# Patient Record
Sex: Male | Born: 1990 | Race: Black or African American | Hispanic: No | Marital: Single | Smoking: Never smoker
Health system: Southern US, Community
[De-identification: ages and names within clinical notes are randomized; demographics above are authoritative.]

---
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
## 2011-02-20 ENCOUNTER — Emergency Department (HOSPITAL_COMMUNITY)
Admission: EM | Admit: 2011-02-20 | Discharge: 2011-02-20 | Discharge status: Against Medical Advice | Payer: Self-pay | Attending: Emergency Medicine | Admitting: Emergency Medicine

## 2011-02-20 DIAGNOSIS — H9209 Otalgia, unspecified ear: Secondary | ICD-10-CM | POA: Insufficient documentation

## 2011-05-04 ENCOUNTER — Encounter: Payer: Self-pay | Admitting: *Deleted

## 2011-05-04 DIAGNOSIS — E049 Nontoxic goiter, unspecified: Secondary | ICD-10-CM | POA: Insufficient documentation

## 2011-05-04 DIAGNOSIS — R7303 Prediabetes: Secondary | ICD-10-CM | POA: Insufficient documentation

## 2011-05-04 DIAGNOSIS — E669 Obesity, unspecified: Secondary | ICD-10-CM

## 2014-02-19 ENCOUNTER — Encounter (HOSPITAL_COMMUNITY): Payer: Self-pay | Admitting: Emergency Medicine

## 2014-02-19 ENCOUNTER — Emergency Department (HOSPITAL_COMMUNITY)
Admission: EM | Admit: 2014-02-19 | Discharge: 2014-02-19 | Disposition: A | Discharge status: Home / Self Care | Payer: Self-pay | Attending: Emergency Medicine | Admitting: Emergency Medicine

## 2014-02-19 ENCOUNTER — Emergency Department (HOSPITAL_COMMUNITY): Payer: Self-pay

## 2014-02-19 DIAGNOSIS — R071 Chest pain on breathing: Secondary | ICD-10-CM

## 2014-02-19 DIAGNOSIS — R05 Cough: Secondary | ICD-10-CM

## 2014-02-19 DIAGNOSIS — R059 Cough, unspecified: Secondary | ICD-10-CM

## 2014-02-19 DIAGNOSIS — J40 Bronchitis, not specified as acute or chronic: Secondary | ICD-10-CM | POA: Insufficient documentation

## 2014-02-19 DIAGNOSIS — R0789 Other chest pain: Secondary | ICD-10-CM

## 2014-02-19 DIAGNOSIS — R1110 Vomiting, unspecified: Secondary | ICD-10-CM | POA: Insufficient documentation

## 2014-02-19 DIAGNOSIS — M940 Chondrocostal junction syndrome [Tietze]: Secondary | ICD-10-CM | POA: Insufficient documentation

## 2014-02-19 DIAGNOSIS — R042 Hemoptysis: Secondary | ICD-10-CM | POA: Insufficient documentation

## 2014-02-19 LAB — URINALYSIS, ROUTINE W REFLEX MICROSCOPIC
Bilirubin Urine: NEGATIVE
Glucose, UA: NEGATIVE mg/dL
Hgb urine dipstick: NEGATIVE
Ketones, ur: NEGATIVE mg/dL
Nitrite: NEGATIVE
Protein, ur: NEGATIVE mg/dL
Specific Gravity, Urine: 1.022 (ref 1.005–1.030)
Urobilinogen, UA: 1 mg/dL (ref 0.0–1.0)
pH: 7.5 (ref 5.0–8.0)

## 2014-02-19 LAB — URINE MICROSCOPIC-ADD ON

## 2014-02-19 MED ORDER — BENZONATATE 100 MG PO CAPS: 200.0000 mg | ORAL_CAPSULE | Freq: Two times a day (BID) | ORAL | Status: DC | PRN

## 2014-02-19 MED ORDER — IBUPROFEN 600 MG PO TABS: 600.0000 mg | ORAL_TABLET | Freq: Four times a day (QID) | ORAL | Status: AC | PRN

## 2014-02-19 NOTE — ED Notes (Signed)
Pt reports productive cough x 3 days. States that yesterday and this AM he noticed hemoptysis. Pt also reports urinary frequency and dysuria x 2 days with left sided flank pain. Pt concerned because he took Niacin so he could pass a UDS, and states "ever since I took that medication all of this has been going on."

## 2014-02-19 NOTE — ED Provider Notes (Signed)
CSN: [id number]     Arrival date & time 02/19/14  1409 History   First MD Initiated Contact with Patient 02/19/14 1624     This chart was scribed for non-physician practitioner, Billy Madura, PA-C working with Gavin Pound. Oletta Lamas, MD by Arlan Organ, ED Scribe. This patient was seen in room TR10C/TR10C and the patient's care was started at 4:37 PM.   Chief Complaint  Patient presents with  . Cough    HPI  HPI Comments: Billy Kirby is a 23 y.o. male who presents to the Emergency Department complaining of an ongoing dry cough consisting of bright red blood x 5 days. Pt reports 3-4 episodes of bloody mucous onset yesterday morning. He has not tried anything OTC for his symptoms, but states he has been drinking a large amount of Orange juice. Symptoms associated with L anterolateral chest discomfort described as sharp and 1 episode of NB/NB emesis. At this time he denies any congestion, central/substernal CP, LOC or near syncope, leg swelling, or hematuria. He denies a PMHx of bleeding disorders or coagulopathies. Denies a personal and family history of blood clots. Denies any surgeries or hospitalizations. Pt has no pertinent medical history, and no other concerns this visit.   History reviewed. No pertinent past medical history. History reviewed. No pertinent past surgical history. History reviewed. No pertinent family history. History  Substance Use Topics  . Smoking status: Never Smoker   . Smokeless tobacco: Not on file  . Alcohol Use: Yes    Review of Systems  Constitutional: Positive for fever. Negative for chills.  HENT: Negative for congestion.   Respiratory: Positive for cough.   Cardiovascular: Positive for chest pain.  Gastrointestinal: Positive for vomiting.  Neurological: Negative for weakness, light-headedness and numbness.     Allergies  Chloraseptic sore throat  Home Medications   Current Outpatient Rx  Name  Route  Sig  Dispense  Refill  . benzonatate (TESSALON)  100 MG capsule   Oral   Take 2 capsules (200 mg total) by mouth 2 (two) times daily as needed for cough.   20 capsule   0   . ibuprofen (ADVIL,MOTRIN) 600 MG tablet   Oral   Take 1 tablet (600 mg total) by mouth every 6 (six) hours as needed.   30 tablet   0     Triage Vitals: BP 137/82  Pulse 73  Temp(Src) 99.8 F (37.7 C)  Resp 18  Wt 174 lb 1 oz (78.954 kg)  SpO2 98%   Physical Exam  Nursing note and vitals reviewed. Constitutional: He is oriented to person, place, and time. He appears well-developed and well-nourished. No distress.  HENT:  Head: Normocephalic and atraumatic.  Mouth/Throat: Oropharynx is clear and moist. No oropharyngeal exudate.  Eyes: Conjunctivae and EOM are normal. Pupils are equal, round, and reactive to light. No scleral icterus.  Neck: Normal range of motion. Neck supple.  Cardiovascular: Normal rate, regular rhythm and normal heart sounds.   Pulmonary/Chest: Effort normal and breath sounds normal. No respiratory distress. He has no wheezes. He has no rales.  No tachypnea, retractions, or accessory muscle use. Chest expansion symmetric. Dry, sporadic, nonbloody and nonproductive cough appreciated at bedside  Abdominal: Soft. He exhibits no distension. There is no tenderness. There is no rebound and no guarding.  Musculoskeletal: Normal range of motion.  Neurological: He is alert and oriented to person, place, and time.  Skin: Skin is warm and dry. No rash noted. He is not diaphoretic. No erythema.  No pallor.  Psychiatric: He has a normal mood and affect. His behavior is normal.    ED Course  Procedures (including critical care time)  DIAGNOSTIC STUDIES: Oxygen Saturation is 98% on RA, Normal by my interpretation.    COORDINATION OF CARE: 4:49 PM- Will order X-Ray. Will give pain medication.  Discussed treatment plan with pt at bedside and pt agreed to plan.     Labs Review Labs Reviewed  URINALYSIS, ROUTINE W REFLEX MICROSCOPIC - Abnormal;  Notable for the following:    Color, Urine AMBER (*)    Leukocytes, UA TRACE (*)    All other components within normal limits  URINE MICROSCOPIC-ADD ON   Imaging Review Dg Chest 2 View  02/19/2014   CLINICAL DATA:  Cough and chest pain  EXAM: CHEST  2 VIEW  COMPARISON:  None.  FINDINGS: Lungs are clear. Heart size and pulmonary vascularity are normal. No adenopathy. No pneumothorax. No bone lesions. .  IMPRESSION: No abnormality noted.   Electronically Signed   By: Bretta BangWilliam  Woodruff M.D.   On: 02/19/2014 15:23     EKG Interpretation None      MDM   Final diagnoses:  Bronchitis  Cough  Costochondral chest pain   Uncomplicated bronchitis and costochondral chest discomfort. Patient well and nontoxic appearing, hemodynamically stable, and afebrile. Physical exam unremarkable. Patient noted to have symmetric chest expansion without retractions or accessory muscle use. He cough sporadically at bedside. His cough is nonproductive and nonbloody. X-ray today negative for focal consolidation or pneumonia. Doubt pulmonary embolism as patient with Wells Score of 1 c/w low risk of PE. He is also without tachycardia, tachypnea, dyspnea or hypoxia. Hemoptysis likely secondary to viral bronchitis. Patient stable and appropriate for discharge with prescription for ibuprofen and Tessalon for cough. Return precautions discussed with patient who verbalizes comfort and understanding with this discharge plan with no unaddressed concerns.  I personally performed the services described in this documentation, which was scribed in my presence. The recorded information has been reviewed and is accurate.   Filed Vitals:   02/19/14 1439 02/19/14 1441 02/19/14 1655  BP: 137/82  122/62  Pulse: 73  61  Temp:  99.8 F (37.7 C) 98 F (36.7 C)  TempSrc:   Oral  Resp: 18  16  Weight: 174 lb 1 oz (78.954 kg)    SpO2: 98%  100%     Billy MaduraKelly Khanh Cordner, PA-C 02/19/14 1708

## 2014-02-19 NOTE — ED Notes (Signed)
Per pt sts cough x 1 week and coughing up a little but of blood sts also his whole left side hurting and especially his kidney area.

## 2014-02-19 NOTE — Discharge Instructions (Signed)
Recommend you take ibuprofen as prescribed for chest discomfort. Take Tessalon as prescribed for cough. You may also try over-the-counter cough medicine such as Robitussin. Recommend that you follow up with your primary care doctor. Return to the emergency department if symptoms worsen such as if you consistently cough up large amounts of blood or experience loss of consciousness or near loss of consciousness.  Bronchitis Bronchitis is inflammation of the airways that extend from the windpipe into the lungs (bronchi). The inflammation often causes mucus to develop, which leads to a cough. If the inflammation becomes severe, it may cause shortness of breath. CAUSES  Bronchitis may be caused by:   Viral infections.   Bacteria.   Cigarette smoke.   Allergens, pollutants, and other irritants.  SIGNS AND SYMPTOMS  The most common symptom of bronchitis is a frequent cough that produces mucus. Other symptoms include:  Fever.   Body aches.   Chest congestion.   Chills.   Shortness of breath.   Sore throat.  DIAGNOSIS  Bronchitis is usually diagnosed through a medical history and physical exam. Tests, such as chest X-rays, are sometimes done to rule out other conditions.  TREATMENT  You may need to avoid contact with whatever caused the problem (smoking, for example). Medicines are sometimes needed. These may include:  Antibiotics. These may be prescribed if the condition is caused by bacteria.  Cough suppressants. These may be prescribed for relief of cough symptoms.   Inhaled medicines. These may be prescribed to help open your airways and make it easier for you to breathe.   Steroid medicines. These may be prescribed for those with recurrent (chronic) bronchitis. HOME CARE INSTRUCTIONS  Get plenty of rest.   Drink enough fluids to keep your urine clear or pale yellow (unless you have a medical condition that requires fluid restriction). Increasing fluids may help  thin your secretions and will prevent dehydration.   Only take over-the-counter or prescription medicines as directed by your health care provider.  Only take antibiotics as directed. Make sure you finish them even if you start to feel better.  Avoid secondhand smoke, irritating chemicals, and strong fumes. These will make bronchitis worse. If you are a smoker, quit smoking. Consider using nicotine gum or skin patches to help control withdrawal symptoms. Quitting smoking will help your lungs heal faster.   Put a cool-mist humidifier in your bedroom at night to moisten the air. This may help loosen mucus. Change the water in the humidifier daily. You can also run the hot water in your shower and sit in the bathroom with the door closed for 5 10 minutes.   Follow up with your health care provider as directed.   Wash your hands frequently to avoid catching bronchitis again or spreading an infection to others.  SEEK MEDICAL CARE IF: Your symptoms do not improve after 1 week of treatment.  SEEK IMMEDIATE MEDICAL CARE IF:  Your fever increases.  You have chills.   You have chest pain.   You have worsening shortness of breath.   You have bloody sputum.  You faint.  You have lightheadedness.  You have a severe headache.   You vomit repeatedly. MAKE SURE YOU:   Understand these instructions.  Will watch your condition.  Will get help right away if you are not doing well or get worse. Document Released: 11/30/2005 Document Revised: 09/20/2013 Document Reviewed: 07/25/2013 [facility] Patient Information 2014 [employer] [place]. Costochondritis Costochondritis is a condition in which the tissue (cartilage) that connects  your ribs with your breastbone (sternum) becomes irritated. It causes pain in the chest and rib area. It usually goes away on its own over time. HOME CARE  Avoid activities that wear you out.  Do not strain your ribs. Avoid activities that use  your:  Chest.  Belly.  Side muscles.  Put ice on the area for the first 2 days after the pain starts.  Put ice in a plastic bag.  Place a towel between your skin and the bag.  Leave the ice on for 20 minutes, 2 3 times a day.  Only take medicine as told by your doctor. GET HELP IF:  You have redness or puffiness (swelling) in the rib area.  Your pain does not go away with rest or medicine. GET HELP RIGHT AWAY IF:   Your pain gets worse.  You are very uncomfortable.  You have trouble breathing.  You cough up blood.  You start sweating or throwing up (vomiting).  You have a fever or lasting symptoms for more than 2 3 days.  You have a fever and your symptoms suddenly get worse. MAKE SURE YOU:   Understand these instructions.  Will watch your condition.  Will get help right away if you are not doing well or get worse. Document Released: 05/18/2008 Document Revised: 08/02/2013 Document Reviewed: 07/04/2013 [facility] Patient Information 2014 [place], [place].

## 2014-02-19 NOTE — ED Notes (Signed)
PA at bedside.

## 2014-02-21 NOTE — ED Provider Notes (Signed)
Medical screening examination/treatment/procedure(s) were performed by non-physician practitioner and as supervising physician I was immediately available for consultation/collaboration.   EKG Interpretation None        Basel Defalco Y. Bruna Dills, MD 02/21/14 2034 

## 2019-09-24 ENCOUNTER — Emergency Department (HOSPITAL_COMMUNITY)
Admission: EM | Admit: 2019-09-24 | Discharge: 2019-09-24 | Disposition: A | Discharge status: Home / Self Care | Payer: Self-pay | Attending: Emergency Medicine | Admitting: Emergency Medicine

## 2019-09-24 ENCOUNTER — Ambulatory Visit (HOSPITAL_COMMUNITY)
Admission: EM | Admit: 2019-09-24 | Discharge: 2019-09-24 | Disposition: A | Discharge status: Other Acute Inpatient Hospital | Payer: Self-pay | Attending: Family Medicine | Admitting: Family Medicine

## 2019-09-24 ENCOUNTER — Encounter (HOSPITAL_COMMUNITY): Payer: Self-pay | Admitting: Emergency Medicine

## 2019-09-24 ENCOUNTER — Other Ambulatory Visit: Payer: Self-pay

## 2019-09-24 ENCOUNTER — Emergency Department (HOSPITAL_COMMUNITY): Payer: Self-pay

## 2019-09-24 ENCOUNTER — Encounter (HOSPITAL_COMMUNITY): Payer: Self-pay

## 2019-09-24 DIAGNOSIS — R112 Nausea with vomiting, unspecified: Secondary | ICD-10-CM

## 2019-09-24 DIAGNOSIS — K2920 Alcoholic gastritis without bleeding: Secondary | ICD-10-CM

## 2019-09-24 LAB — LIPASE, BLOOD: Lipase: 21 U/L (ref 11–51)

## 2019-09-24 LAB — CBC
HCT: 42.7 % (ref 39.0–52.0)
Hemoglobin: 14.5 g/dL (ref 13.0–17.0)
MCH: 30.7 pg (ref 26.0–34.0)
MCHC: 34 g/dL (ref 30.0–36.0)
MCV: 90.3 fL (ref 80.0–100.0)
Platelets: 283 10*3/uL (ref 150–400)
RBC: 4.73 MIL/uL (ref 4.22–5.81)
RDW: 13.2 % (ref 11.5–15.5)
WBC: 10.6 10*3/uL — ABNORMAL HIGH (ref 4.0–10.5)
nRBC: 0 % (ref 0.0–0.2)

## 2019-09-24 LAB — COMPREHENSIVE METABOLIC PANEL
ALT: 19 U/L (ref 0–44)
AST: 28 U/L (ref 15–41)
Albumin: 4.6 g/dL (ref 3.5–5.0)
Alkaline Phosphatase: 63 U/L (ref 38–126)
Anion gap: 14 (ref 5–15)
BUN: 11 mg/dL (ref 6–20)
CO2: 22 mmol/L (ref 22–32)
Calcium: 9.7 mg/dL (ref 8.9–10.3)
Chloride: 103 mmol/L (ref 98–111)
Creatinine, Ser: 1.13 mg/dL (ref 0.61–1.24)
GFR calc Af Amer: >60 mL/min (ref >60)
GFR calc non Af Amer: >60 mL/min (ref >60)
Glucose, Bld: 117 mg/dL — ABNORMAL HIGH (ref 70–99)
Potassium: 3.8 mmol/L (ref 3.5–5.1)
Sodium: 139 mmol/L (ref 135–145)
Total Bilirubin: 1.5 mg/dL — ABNORMAL HIGH (ref 0.3–1.2)
Total Protein: 8.3 g/dL — ABNORMAL HIGH (ref 6.5–8.1)

## 2019-09-24 MED ORDER — ALUM & MAG HYDROXIDE-SIMETH 200-200-20 MG/5ML PO SUSP: 30.0000 mL | Freq: Once | ORAL | Status: DC

## 2019-09-24 MED ORDER — ONDANSETRON 4 MG PO TBDP
4.0000 mg | ORAL_TABLET | Freq: Once | ORAL | Status: AC | PRN
  Administered 2019-09-24: 4 mg via ORAL
  Filled 2019-09-24: qty 1

## 2019-09-24 MED ORDER — IOHEXOL 300 MG/ML  SOLN
100.0000 mL | Freq: Once | INTRAMUSCULAR | Status: AC | PRN
  Administered 2019-09-24: 18:00:00 100 mL via INTRAVENOUS

## 2019-09-24 MED ORDER — ONDANSETRON 4 MG PO TBDP
4.0000 mg | ORAL_TABLET | Freq: Once | ORAL | Status: AC
  Administered 2019-09-24: 12:00:00 4 mg via ORAL

## 2019-09-24 MED ORDER — LIDOCAINE VISCOUS HCL 2 % MT SOLN: 15.0000 mL | Freq: Once | OROMUCOSAL | Status: DC

## 2019-09-24 MED ORDER — SODIUM CHLORIDE 0.9 % IV BOLUS
1000.0000 mL | Freq: Once | INTRAVENOUS | Status: AC
  Administered 2019-09-24: 16:00:00 1000 mL via INTRAVENOUS

## 2019-09-24 MED ORDER — SODIUM CHLORIDE 0.9% FLUSH
3.0000 mL | Freq: Once | INTRAVENOUS | Status: AC
  Administered 2019-09-24: 16:00:00 3 mL via INTRAVENOUS

## 2019-09-24 MED ORDER — ONDANSETRON HCL 4 MG PO TABS: 4.0000 mg | ORAL_TABLET | Freq: Four times a day (QID) | ORAL | 0 refills | Status: AC

## 2019-09-24 MED ORDER — ONDANSETRON 4 MG PO TBDP
ORAL_TABLET | ORAL | Status: AC
  Filled 2019-09-24: qty 1

## 2019-09-24 MED ORDER — ONDANSETRON HCL 4 MG/2ML IJ SOLN
4.0000 mg | Freq: Once | INTRAMUSCULAR | Status: AC
  Administered 2019-09-24: 13:00:00 4 mg via INTRAMUSCULAR

## 2019-09-24 MED ORDER — PANTOPRAZOLE SODIUM 20 MG PO TBEC: 20.0000 mg | DELAYED_RELEASE_TABLET | Freq: Every day | ORAL | 0 refills | Status: AC

## 2019-09-24 MED ORDER — ONDANSETRON HCL 4 MG/2ML IJ SOLN
INTRAMUSCULAR | Status: AC
  Filled 2019-09-24: qty 2

## 2019-09-24 MED ORDER — METOCLOPRAMIDE HCL 5 MG/ML IJ SOLN
10.0000 mg | Freq: Once | INTRAMUSCULAR | Status: AC
  Administered 2019-09-24: 17:00:00 10 mg via INTRAVENOUS
  Filled 2019-09-24: qty 2

## 2019-09-24 NOTE — ED Provider Notes (Signed)
[facility] EMERGENCY DEPARTMENT Provider Note   CSN: [id number] Arrival date & time: 09/24/19  1342     History   Chief Complaint Chief Complaint  Patient presents with   Nausea   Emesis    HPI Billy Kirby is a 28 y.o. male who presents to the ED today after being seen at urgent care.  Patient reports that he went out drinking Friday night and soon afterwards he began having vomiting.  He has had numerous bouts of bloody nonbilious emesis since then has not been able to keep anything down including water.  Does endorse some mild abdominal discomfort.  No diarrhea.  Was seen at urgent care and given Zofran ODT 3 times.  He was unable to keep anything down including some sips of water.  Patient was sent here for further evaluation with concern for intractable vomiting.  Patient denies fever, chills, chest pain, shortness of breath, hematemesis, coffee-ground emesis, melena, hematochezia, urinary symptoms, any other associated symptoms.      History reviewed. No pertinent past medical history.  Patient Active Problem List   Diagnosis Date Noted   Goiter, unspecified 05/04/2011   Obesity 05/04/2011   Pre-diabetes 05/04/2011    History reviewed. No pertinent surgical history.      Home Medications    Prior to Admission medications   Medication Sig Start Date End Date Taking? Authorizing Provider  ibuprofen (ADVIL,MOTRIN) 600 MG tablet Take 1 tablet (600 mg total) by mouth every 6 (six) hours as needed. 02/19/14  Yes Antony Madura, PA-C  ondansetron (ZOFRAN) 4 MG tablet Take 1 tablet (4 mg total) by mouth every 6 (six) hours. 09/24/19   Tredarius Cobern, PA-C  pantoprazole (PROTONIX) 20 MG tablet Take 1 tablet (20 mg total) by mouth daily for 10 days. 09/24/19 10/04/19  Tanda Rockers, PA-C    Family History No family history on file.  Social History Social History   Tobacco Use   Smoking status: Never Smoker   Smokeless tobacco: Never Used    Substance Use Topics   Alcohol use: Yes    Alcohol/week: 3.0 standard drinks    Types: 3 Shots of liquor per week   Drug use: Yes    Frequency: 3.0 times per week    Types: Marijuana     Allergies   Chloraseptic sore throat [acetaminophen]   Review of Systems Review of Systems  Constitutional: Negative for chills and fever.  HENT: Negative for congestion.   Eyes: Negative for visual disturbance.  Respiratory: Negative for cough and shortness of breath.   Cardiovascular: Negative for chest pain.  Gastrointestinal: Positive for abdominal pain, nausea and vomiting. Negative for blood in stool, constipation and diarrhea.  Genitourinary: Negative for difficulty urinating, dysuria, flank pain and hematuria.  Musculoskeletal: Negative for myalgias.  Skin: Negative for rash.  Neurological: Negative for headaches.     Physical Exam Updated Vital Signs BP (!) 143/84 (BP Location: Right Arm)    Pulse (!) 53    Temp 98.3 F (36.8 C) (Oral)    Resp 14    Ht 5\' 8"  (1.727 m)    Wt 90.7 kg    SpO2 100%    BMI 30.41 kg/m   Physical Exam Vitals signs and nursing note reviewed.  Constitutional:      Appearance: He is not ill-appearing.  HENT:     Head: Normocephalic and atraumatic.  Eyes:     Extraocular Movements: Extraocular movements intact.     Conjunctiva/sclera: Conjunctivae normal.  Pupils: Pupils are equal, round, and reactive to light.  Neck:     Musculoskeletal: Neck supple.  Cardiovascular:     Rate and Rhythm: Normal rate and regular rhythm.     Pulses: Normal pulses.  Pulmonary:     Effort: Pulmonary effort is normal.     Breath sounds: Normal breath sounds. No wheezing, rhonchi or rales.  Abdominal:     Palpations: Abdomen is soft.     Tenderness: There is abdominal tenderness. There is no right CVA tenderness, left CVA tenderness, guarding or rebound.     Comments: Soft, mild diffuse abdominal TTP, +BS throughout, no r/g/r, neg murphy's, neg mcburney's, no  CVA TTP  Skin:    General: Skin is warm and dry.  Neurological:     General: No focal deficit present.     Mental Status: He is alert and oriented to person, place, and time.      ED Treatments / Results  Labs (all labs ordered are listed, but only abnormal results are displayed) Labs Reviewed  COMPREHENSIVE METABOLIC PANEL - Abnormal; Notable for the following components:      Result Value   Glucose, Bld 117 (*)    Total Protein 8.3 (*)    Total Bilirubin 1.5 (*)    All other components within normal limits  CBC - Abnormal; Notable for the following components:   WBC 10.6 (*)    All other components within normal limits  LIPASE, BLOOD    EKG None  Radiology Ct Abdomen Pelvis W Contrast  Result Date: 09/24/2019 CLINICAL DATA:  Vomiting after drinking on Friday. Generalized pain. EXAM: CT ABDOMEN AND PELVIS WITH CONTRAST TECHNIQUE: Multidetector CT imaging of the abdomen and pelvis was performed using the standard protocol following bolus administration of intravenous contrast. CONTRAST:  152mL OMNIPAQUE IOHEXOL 300 MG/ML  SOLN COMPARISON:  None. FINDINGS: Lower chest: Clear lung bases. Normal heart size without pericardial or pleural effusion. Hepatobiliary: Normal liver. Normal gallbladder, without biliary ductal dilatation. Pancreas: Normal, without mass or ductal dilatation. Spleen: Normal in size, without focal abnormality. Adrenals/Urinary Tract: Normal adrenal glands. Normal kidneys, without hydronephrosis. Normal urinary bladder. Stomach/Bowel: Normal stomach, without wall thickening. Normal colon, appendix, and terminal ileum. Normal small bowel. No free intraperitoneal air. Vascular/Lymphatic: Normal caliber of the aorta and branch vessels. No abdominopelvic adenopathy. Reproductive: Normal prostate. Other: No significant free fluid. Musculoskeletal: No acute osseous abnormality. IMPRESSION: Normal abdominopelvic CT.  No explanation for patient's symptoms. Electronically  Signed   By: Abigail Miyamoto M.D.   On: 09/24/2019 18:33    Procedures Procedures (including critical care time)  Medications Ordered in ED Medications  sodium chloride flush (NS) 0.9 % injection 3 mL (3 mLs Intravenous Given 09/24/19 1629)  ondansetron (ZOFRAN-ODT) disintegrating tablet 4 mg (4 mg Oral Given 09/24/19 1358)  sodium chloride 0.9 % bolus 1,000 mL (1,000 mLs Intravenous New Bag/Given 09/24/19 1628)  metoCLOPramide (REGLAN) injection 10 mg (10 mg Intravenous Given 09/24/19 1652)  iohexol (OMNIPAQUE) 300 MG/ML solution 100 mL (100 mLs Intravenous Contrast Given 09/24/19 1753)     Initial Impression / Assessment and Plan / ED Course  I have reviewed the triage vital signs and the nursing notes.  Pertinent labs & imaging results that were available during my care of the patient were reviewed by me and considered in my medical decision making (see chart for details).    28 year old male who presents after being seen at urgent care for nonbloody nonbilious emesis since binge drinking Friday night.  Peers he has been unable to keep anything down since drinking Friday night which prompted his urgent care visit today.  He was sent here after 3 rounds of Zofran and continued vomiting.  Denies evaluated patient he states he had not thrown up since being in the ED.  Shortly after getting IV access and starting fluids he vomited again.  Have ordered Reglan at this time.   Lab work obtained - mildly elevated leukocytosis at 10.6 although suspect this is more due to pain than infection.  Patient afebrile without tachycardia or tachypnea on exam.  No electrolyte abnormalities today.  Calcium and sodium within normal limits.  No decrease in bicarb and no anion gap to suggest metabolic acidosis today.  Creatinine mildly elevated at 1.13.  Do not have baseline to compare to.  Patient already receiving fluids.  Lipase negative.  On reevaluation patient still complaining of some diffuse abdominal  pain.  Suspect gastritis more at this point but will obtain CT abdomen pelvis to rule out any acute process.   As patient was being wheeled back from CT scanner he was requesting something to drink.  Will fluid challenge at this time and await CT results.   CT negative. Patient able to tolerate fluids without difficulty. Will discharge home at this time with short course of zofran and protonix. Advised to follow up with PCP - [facility]. Strict return precautions have been discussed with pt and he is in agreement with plan at this time. Urinalysis not collected yet - do not feel it would change treatment course. Pt is without any urinary symptoms. Will discontinue at this time and discharge home.   This note was prepared using Dragon voice recognition software and may include unintentional dictation errors due to the inherent limitations of voice recognition software.       Final Clinical Impressions(s) / ED Diagnoses   Final diagnoses:  Acute alcoholic gastritis without hemorrhage    ED Discharge Orders         Ordered    ondansetron (ZOFRAN) 4 MG tablet  Every 6 hours     09/24/19 1933    pantoprazole (PROTONIX) 20 MG tablet  Daily     09/24/19 1933           Tanda RockersVenter, Ahlayah Tarkowski, PA-C 09/24/19 Barbette Reichmann1935    Pickering, Nathan, MD 09/24/19 2312

## 2019-09-24 NOTE — ED Notes (Signed)
Pt given approx 119mL of fluid, tolerated well.

## 2019-09-24 NOTE — ED Provider Notes (Signed)
[facility]    CSN: [id number] Arrival date & time: 09/24/19  1145      History   Chief Complaint Chief Complaint  Patient presents with  . Emesis    HPI Billy Kirby is a 28 y.o. male.   HPI  Patient states that he is usually not much of a drinker.  He states that he was drinking Friday night.  He did not think he drank to excess.  He did drink a mixed drink which is unusual for him.  He states that he started having vomiting on Saturday.  Vomited all day Saturday and then again today.  Cannot keep down water.  He does not have a lot of abdominal pain.  He said no diarrhea.  No cramping.  He just cannot eat or drink anything without vomiting.  In between vomiting spells he suffers with nausea.  No history of GERD.  No history of gastritis, pancreatitis, or other GI malady.  Usually healthy. He had prediabetes as a child.  He states he "outgrew it"  History reviewed. No pertinent past medical history.  Patient Active Problem List   Diagnosis Date Noted  . Goiter, unspecified 05/04/2011  . Obesity 05/04/2011  . Pre-diabetes 05/04/2011    History reviewed. No pertinent surgical history.     Home Medications    Prior to Admission medications   Medication Sig Start Date End Date Taking? Authorizing Provider  ibuprofen (ADVIL,MOTRIN) 600 MG tablet Take 1 tablet (600 mg total) by mouth every 6 (six) hours as needed. 02/19/14   Antony Madura, PA-C    Family History History reviewed. No pertinent family history.  Social History Social History   Tobacco Use  . Smoking status: Never Smoker  . Smokeless tobacco: Never Used  Substance Use Topics  . Alcohol use: Yes    Alcohol/week: 3.0 standard drinks    Types: 3 Shots of liquor per week  . Drug use: Yes    Frequency: 3.0 times per week    Types: Marijuana     Allergies   Chloraseptic sore throat [acetaminophen]   Review of Systems Review of Systems  Constitutional: Positive for fatigue. Negative  for chills and fever.  HENT: Negative for ear pain and sore throat.   Eyes: Negative for pain and visual disturbance.  Respiratory: Negative for cough and shortness of breath.   Cardiovascular: Negative for chest pain and palpitations.  Gastrointestinal: Positive for nausea and vomiting. Negative for abdominal distention, abdominal pain and diarrhea.  Genitourinary: Negative for dysuria and hematuria.  Musculoskeletal: Negative for arthralgias and back pain.  Skin: Negative for color change and rash.  Neurological: Negative for seizures and syncope.  All other systems reviewed and are negative.    Physical Exam Triage Vital Signs ED Triage Vitals  Enc Vitals Group     BP 09/24/19 1208 134/84     Pulse Rate 09/24/19 1208 (!) 56     Resp 09/24/19 1208 18     Temp 09/24/19 1208 98.5 F (36.9 C)     Temp Source 09/24/19 1208 Oral     SpO2 09/24/19 1208 96 %     Weight --      Height --      Head Circumference --      Peak Flow --      Pain Score 09/24/19 1209 5     Pain Loc --      Pain Edu? --      Excl. in GC? --  No data found.  Updated Vital Signs BP 134/84 (BP Location: Right Arm)   Pulse (!) 56   Temp 98.5 F (36.9 C) (Oral)   Resp 18   SpO2 96%   Visual Acuity Right Eye Distance:   Left Eye Distance:   Bilateral Distance:    Right Eye Near:   Left Eye Near:    Bilateral Near:     Physical Exam Constitutional:      General: He is not in acute distress.    Appearance: He is well-developed and normal weight. He is ill-appearing.  HENT:     Head: Normocephalic and atraumatic.     Mouth/Throat:     Mouth: Mucous membranes are moist.  Eyes:     Conjunctiva/sclera: Conjunctivae normal.     Pupils: Pupils are equal, round, and reactive to light.  Neck:     Musculoskeletal: Normal range of motion and neck supple.  Cardiovascular:     Rate and Rhythm: Normal rate and regular rhythm.     Heart sounds: Normal heart sounds.  Pulmonary:     Effort:  Pulmonary effort is normal. No respiratory distress.     Breath sounds: Normal breath sounds. No wheezing.  Abdominal:     General: Bowel sounds are normal. There is no distension.     Palpations: Abdomen is soft.     Tenderness: There is no abdominal tenderness.  Musculoskeletal: Normal range of motion.  Lymphadenopathy:     Cervical: No cervical adenopathy.  Skin:    General: Skin is warm and dry.  Neurological:     Mental Status: He is alert.  Psychiatric:        Behavior: Behavior normal.      UC Treatments / Results  Labs (all labs ordered are listed, but only abnormal results are displayed) Labs Reviewed - No data to display  EKG   Radiology No results found.  Procedures Procedures (including critical care time)  Medications Ordered in UC Medications  ondansetron (ZOFRAN-ODT) disintegrating tablet 4 mg (4 mg Oral Given 09/24/19 1223)  ondansetron (ZOFRAN) injection 4 mg (4 mg Intramuscular Given 09/24/19 1233)  ondansetron (ZOFRAN-ODT) 4 MG disintegrating tablet (has no administration in time range)  ondansetron (ZOFRAN) 4 MG/2ML injection (has no administration in time range)    Initial Impression / Assessment and Plan / UC Course  I have reviewed the triage vital signs and the nursing notes.  Pertinent labs & imaging results that were available during my care of the patient were reviewed by me and considered in my medical decision making (see chart for details).     Patient was given Zofran ODT.  With then 10 minutes he was actively vomiting.  He was then given Zofran IM.  After 30 minutes he stated that his nausea was gone.  We tried some sips of water.  This caused him to actively vomit again.  I originally had ordered a GI cocktail.  We did not have to time to do this before he was throwing up, so the order was discontinued.  With his intractable vomiting, believe he needs to go the emergency room for additional testing and likely IV fluids Final Clinical  Impressions(s) / UC Diagnoses   Final diagnoses:  Intractable vomiting with nausea, unspecified vomiting type     Discharge Instructions     Since you are unable to keep down water after our treatment, I recommend you go to the ER For additional evaluation.   ED Prescriptions  None     PDMP not reviewed this encounter.   Raylene Everts, MD 09/24/19 [phone]

## 2019-09-24 NOTE — ED Notes (Signed)
Patient is being discharged from the [facility] and sent to the Emergency Department via POV. Per SN, patient is stable but in need of higher level of care due to vomiting and unable to hold down POs. Patient is aware and verbalizes understanding of plan of care.  Vitals:   09/24/19 1208  BP: 134/84  Pulse: (!) 56  Resp: 18  Temp: 98.5 F (36.9 C)  SpO2: 96%

## 2019-09-24 NOTE — Discharge Instructions (Signed)
Your labwork and CT scan were reassuring today It is suspected that you are having irritation in your stomach from the alcohol Please take medication as prescribed - it will help coat your stomach  Avoid alcohol  I have prescribed a short course of nausea medicine for you to take as needed as well Please follow up with your PCP. If you do not have one you can follow up with [facility] for your primary care needs

## 2019-09-24 NOTE — Discharge Instructions (Signed)
Since you are unable to keep down water after our treatment, I recommend you go to the ER For additional evaluation.

## 2019-09-24 NOTE — ED Triage Notes (Signed)
Patient states he is having nausea and vomiting after drinking 3 days ago. Patient stated he had 2-3 shots of liquor and thought he had alcohol poisoning but it has not stopped. Patient denies pain.

## 2019-09-24 NOTE — ED Triage Notes (Signed)
Pt here for vomiting x 2 days since drinking large amounts of ETOH; pt sts some blood tinged vomit

## 2020-02-15 ENCOUNTER — Encounter (HOSPITAL_COMMUNITY): Payer: Self-pay | Admitting: Emergency Medicine

## 2020-02-15 ENCOUNTER — Ambulatory Visit (HOSPITAL_COMMUNITY)
Admission: EM | Admit: 2020-02-15 | Discharge: 2020-02-15 | Disposition: A | Discharge status: Home / Self Care | Payer: Self-pay | Attending: Family Medicine | Admitting: Family Medicine

## 2020-02-15 ENCOUNTER — Other Ambulatory Visit: Payer: Self-pay

## 2020-02-15 DIAGNOSIS — Z202 Contact with and (suspected) exposure to infections with a predominantly sexual mode of transmission: Secondary | ICD-10-CM | POA: Insufficient documentation

## 2020-02-15 MED ORDER — METRONIDAZOLE 500 MG PO TABS: 500.0000 mg | ORAL_TABLET | Freq: Two times a day (BID) | ORAL | 0 refills | Status: AC

## 2020-02-15 NOTE — ED Triage Notes (Signed)
Pt here for std exposure to trichomonas

## 2020-02-15 NOTE — Discharge Instructions (Addendum)
We have sent testing for sexually transmitted infections. We will notify you of any positive results once they are received. If required, we will prescribe any medications you might need.  Please refrain from all sexual activity for at least the next seven days.  

## 2020-02-15 NOTE — ED Notes (Signed)
Patient refused blood draw, states has an appointment and is leaving.  Dr. Tracie Harrier notified.

## 2020-02-15 NOTE — ED Provider Notes (Signed)
[place]   [id number] 02/15/20 Arrival Time: 4496  ASSESSMENT & PLAN:  1. Possible exposure to STD     Meds ordered this encounter  Medications  . metroNIDAZOLE (FLAGYL) 500 MG tablet    Sig: Take 1 tablet (500 mg total) by mouth 2 (two) times daily.    Dispense:  14 tablet    Refill:  0   Declines empiric treatment for gc/chlamydia. To await cytology results.   Discharge Instructions     We have sent testing for sexually transmitted infections. We will notify you of any positive results once they are received. If required, we will prescribe any medications you might need.  Please refrain from all sexual activity for at least the next seven days.     Pending: Labs Reviewed  CYTOLOGY, (ORAL, ANAL, URETHRAL) ANCILLARY ONLY     Will notify of any positive results. Instructed to refrain from sexual activity for at least seven days.  Reviewed expectations re: course of current medical issues. Questions answered. Outlined signs and symptoms indicating need for more acute intervention. Patient verbalized understanding. After Visit Summary given.   SUBJECTIVE:  Billy Kirby is a 29 y.o. male who reports exposure to trichomonas. Partner tested positive. Reports no symptoms. Afebrile. No abdominal or pelvic pain. Normal PO intake wihout n/v. No genital rashes or lesions. Reports that he is sexually active with single male partner.  History of STI: none reported.   OBJECTIVE:  Vitals:   02/15/20 1631  BP: 133/83  Pulse: 65  Resp: 18  Temp: 98.4 F (36.9 C)  TempSrc: Oral  SpO2: 100%     General appearance: alert, cooperative, appears stated age and no distress Lungs: unlabored respirations; speaks full sentences without difficulty Back: no CVA tenderness; FROM at waist Abdomen: soft, non-tender GU: normal Skin: warm and dry Psychological: alert and cooperative; normal mood and affect.    Labs Reviewed  CYTOLOGY, (ORAL, ANAL, URETHRAL)  ANCILLARY ONLY    Allergies  Allergen Reactions  . Chloraseptic Sore Throat [Acetaminophen] Swelling    History reviewed. No pertinent past medical history. History reviewed. No pertinent family history. Social History   Socioeconomic History  . Marital status: Single    Spouse name: Not on file  . Number of children: Not on file  . Years of education: Not on file  . Highest education level: Not on file  Occupational History  . Not on file  Tobacco Use  . Smoking status: Never Smoker  . Smokeless tobacco: Never Used  Substance and Sexual Activity  . Alcohol use: Yes    Alcohol/week: 3.0 standard drinks    Types: 3 Shots of liquor per week  . Drug use: Yes    Frequency: 3.0 times per week    Types: Marijuana  . Sexual activity: Yes  Other Topics Concern  . Not on file  Social History Narrative  . Not on file   Social Determinants of Health   Financial Resource Strain:   . Difficulty of Paying Living Expenses: Not on file  Food Insecurity:   . Worried About Charity fundraiser in the Last Year: Not on file  . Ran Out of Food in the Last Year: Not on file  Transportation Needs:   . Lack of Transportation (Medical): Not on file  . Lack of Transportation (Non-Medical): Not on file  Physical Activity:   . Days of Exercise per Week: Not on file  . Minutes of Exercise per Session: Not on file  Stress:   . Feeling of Stress : Not on file  Social Connections:   . Frequency of Communication with Friends and Family: Not on file  . Frequency of Social Gatherings with Friends and Family: Not on file  . Attends Religious Services: Not on file  . Active Member of Clubs or Organizations: Not on file  . Attends Banker Meetings: Not on file  . Marital Status: Not on file  Intimate Partner Violence:   . Fear of Current or Ex-Partner: Not on file  . Emotionally Abused: Not on file  . Physically Abused: Not on file  . Sexually Abused: Not on file           Mardella Layman, MD 02/15/20 [phone]

## 2020-02-17 LAB — CYTOLOGY, (ORAL, ANAL, URETHRAL) ANCILLARY ONLY
Chlamydia: NEGATIVE
Neisseria Gonorrhea: NEGATIVE
Trichomonas: NEGATIVE

## 2020-08-14 IMAGING — CT CT ABD-PELV W/ CM
2 of 4 series · 16 of 46 positions shown, 18 images · IV contrast (Omni 300)
Comparison: None.

CLINICAL DATA: Vomiting after drinking on [REDACTED]. Generalized pain.

EXAM:
CT ABDOMEN AND PELVIS WITH CONTRAST
TECHNIQUE: Multidetector CT imaging of the abdomen and pelvis was performed
using the standard protocol following bolus administration of
intravenous contrast.
CONTRAST:  100mL OMNIPAQUE IOHEXOL 300 MG/ML  SOLN

[Series 3: a/p w/ 5mm · axial · 0.98mm/px · z∈[-435,+10]mm · 13 of 99 slices shown, 15 images]
[im 5/99  soft-tissue]
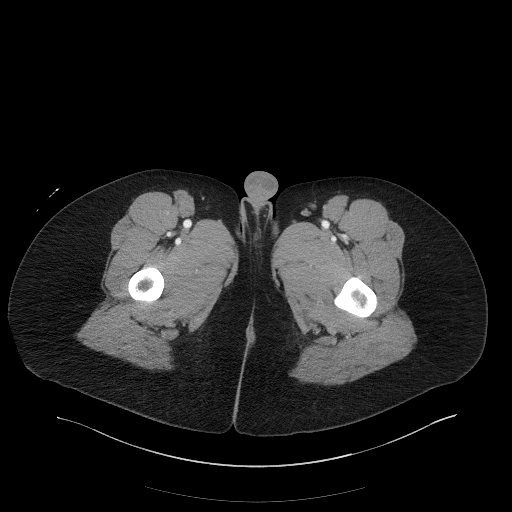
[im 5/99  bone]
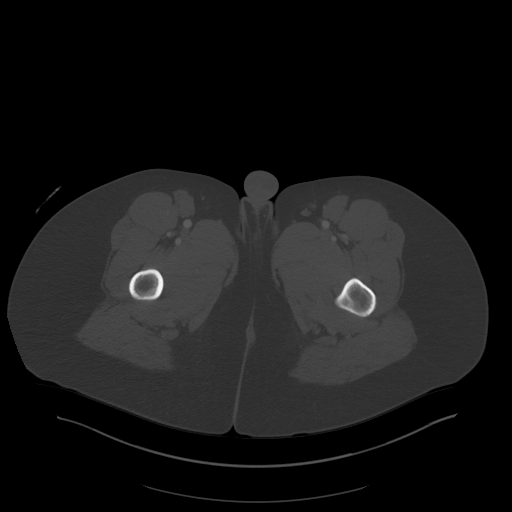
[im 13/99  soft-tissue]
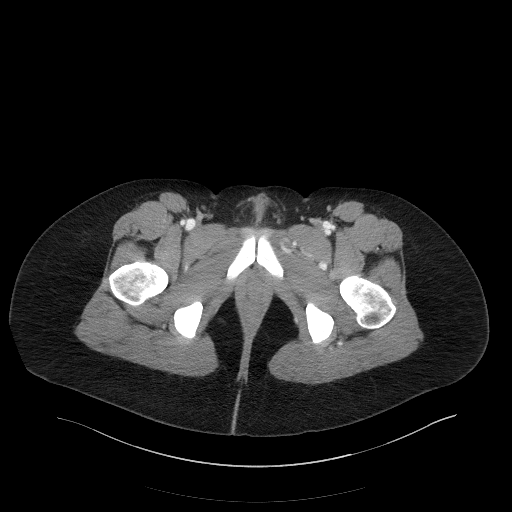
[im 21/99  soft-tissue]
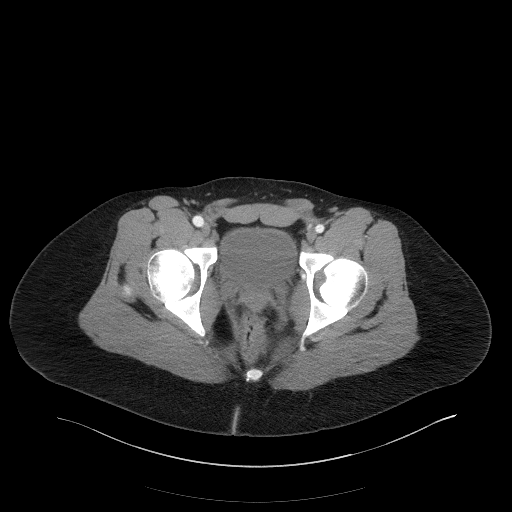
[im 29/99  soft-tissue]
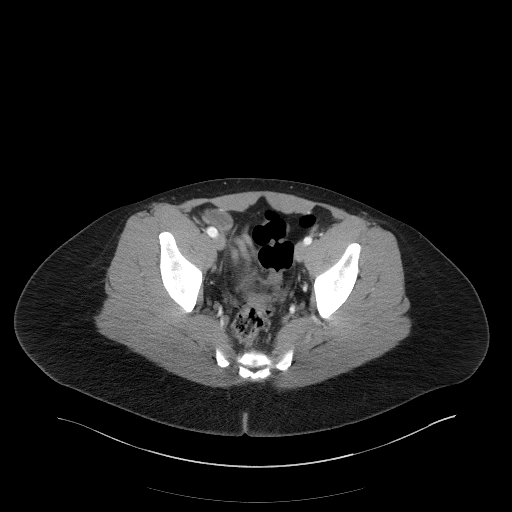
[im 33/99  soft-tissue]
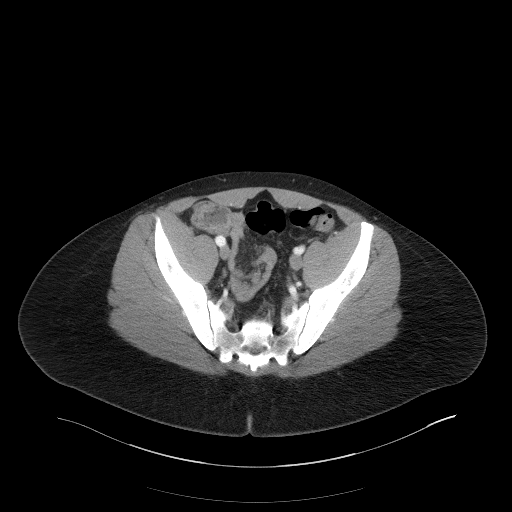
[im 41/99  soft-tissue]
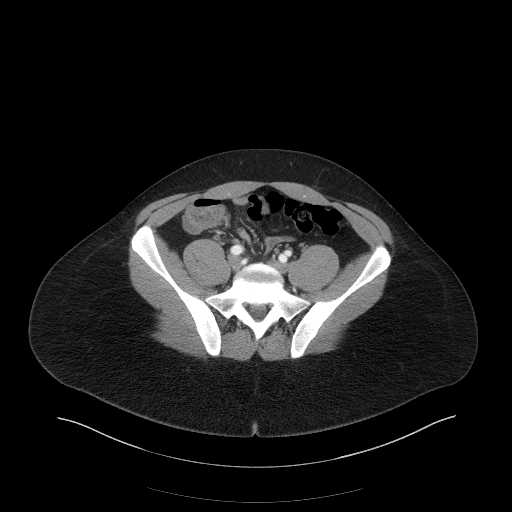
[im 50/99  soft-tissue]
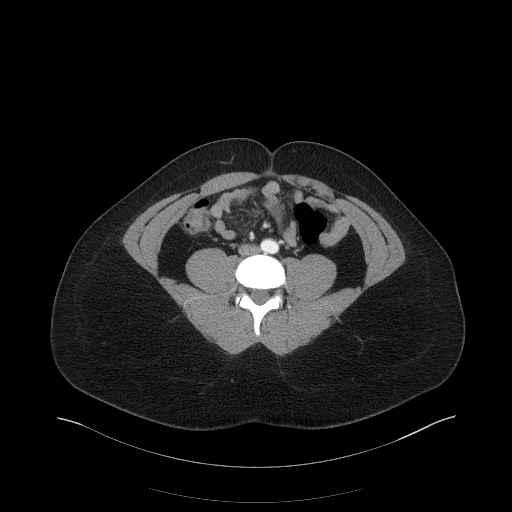
[im 58/99  soft-tissue]
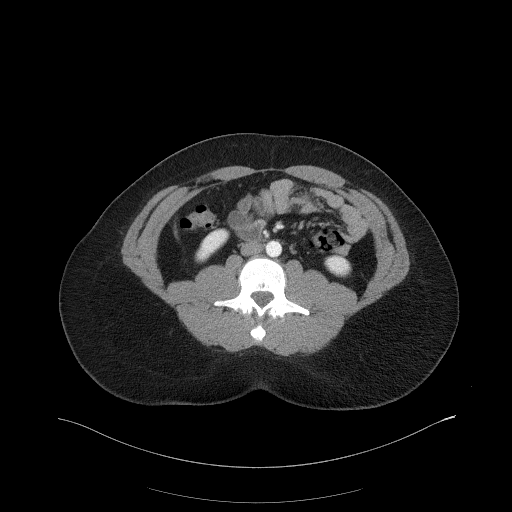
[im 66/99  soft-tissue]
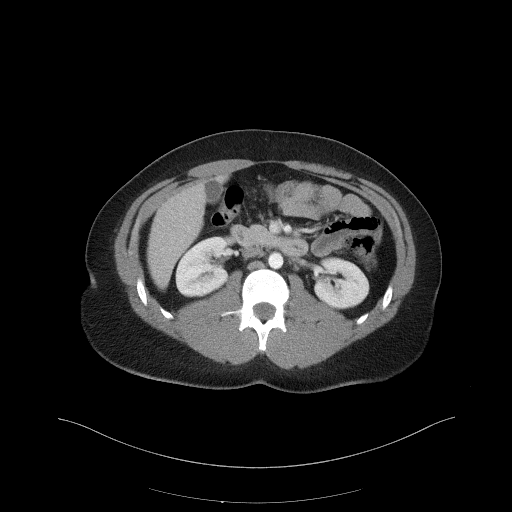
[im 66/99  bone]
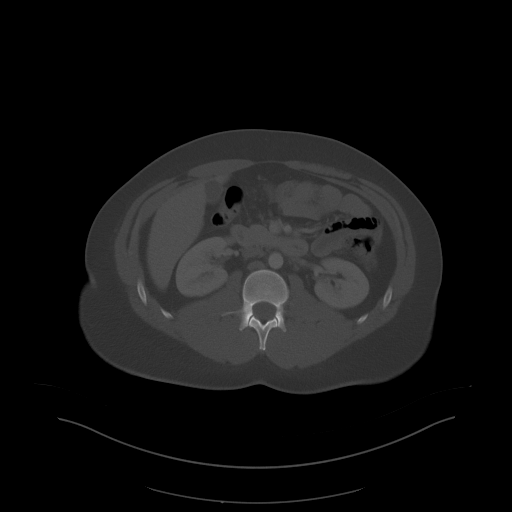
[im 70/99  soft-tissue]
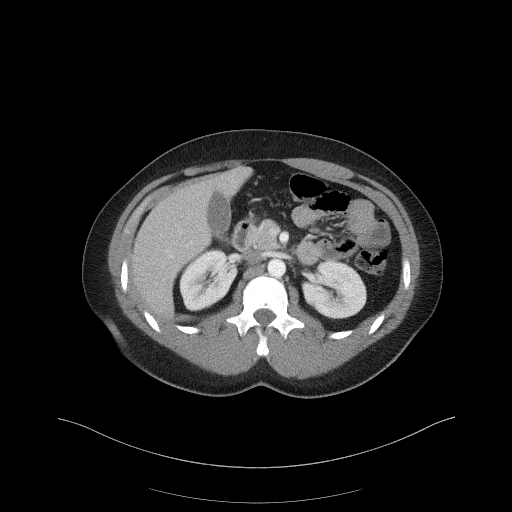
[im 78/99  soft-tissue]
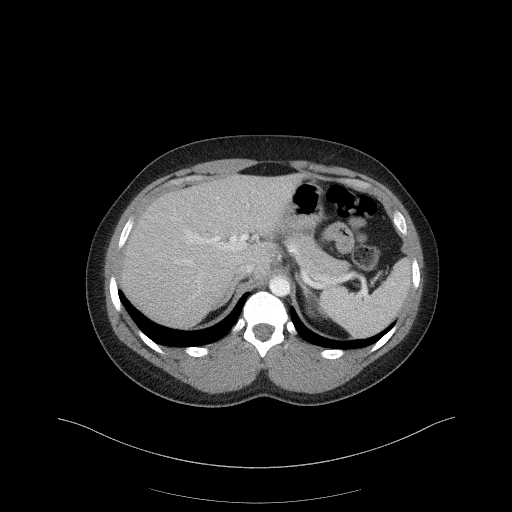
[im 86/99  soft-tissue]
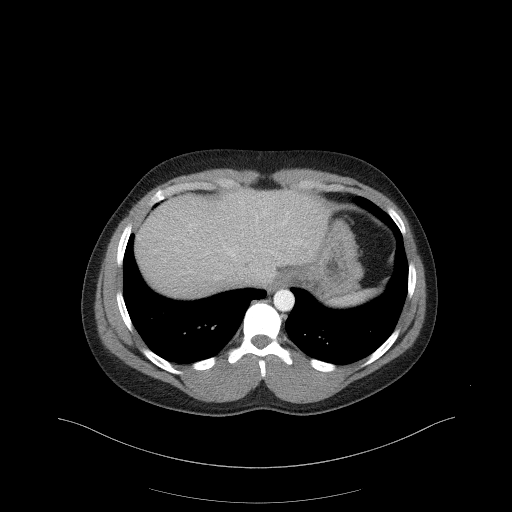
[im 94/99  soft-tissue]
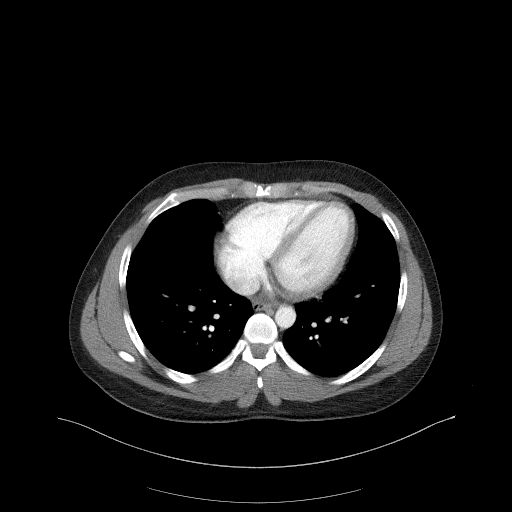

[Series 6: a/p w/ cor · coronal · 0.79mm/px · 3 of 164 slices shown]
[im 55/164  soft-tissue]
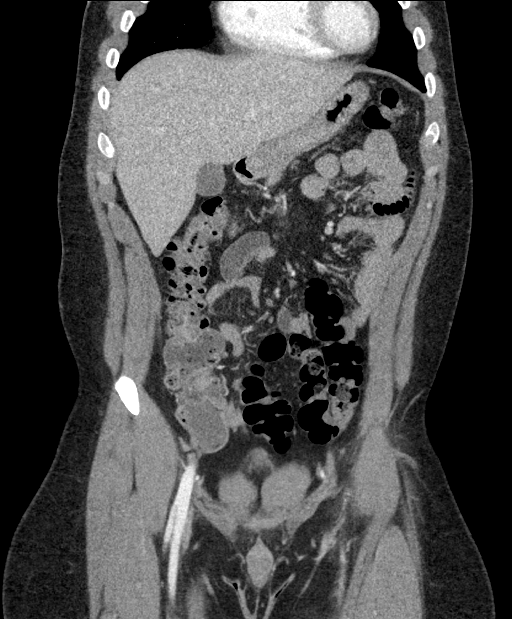
[im 73/164  soft-tissue]
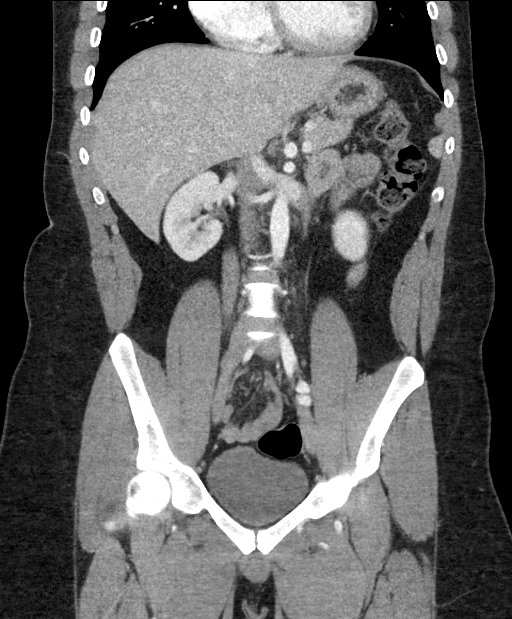
[im 91/164  soft-tissue]
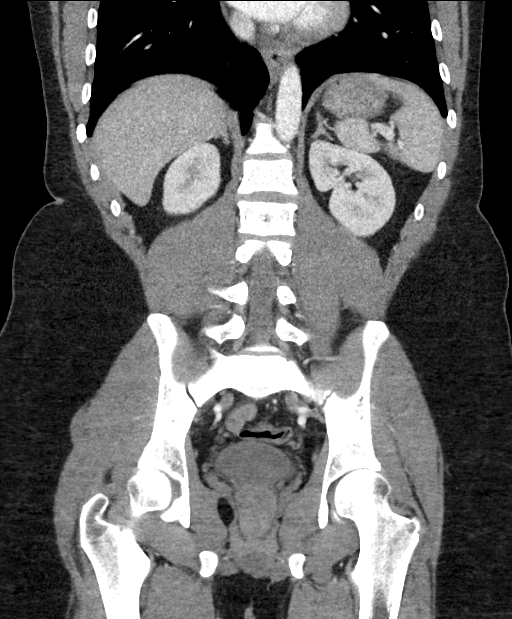

[16 of 46 positions shown; findings below may reference images not displayed]

FINDINGS: Lower chest: Clear lung bases. Normal heart size without pericardial
or pleural effusion.

Hepatobiliary: Normal liver. Normal gallbladder, without biliary
ductal dilatation.

Pancreas: Normal, without mass or ductal dilatation.

Spleen: Normal in size, without focal abnormality.

Adrenals/Urinary Tract: Normal adrenal glands. Normal kidneys,
without hydronephrosis. Normal urinary bladder.

Stomach/Bowel: Normal stomach, without wall thickening. Normal
colon, appendix, and terminal ileum. Normal small bowel. No free
intraperitoneal air.

Vascular/Lymphatic: Normal caliber of the aorta and branch vessels.
No abdominopelvic adenopathy.

Reproductive: Normal prostate.

Other: No significant free fluid.

Musculoskeletal: No acute osseous abnormality.
IMPRESSION: Normal abdominopelvic CT.  No explanation for patient's symptoms.
